# Patient Record
Sex: Male | Born: 1950 | Race: White | Hispanic: No | Marital: Single
Health system: Southern US, Community
[De-identification: ages and names within clinical notes are randomized; demographics above are authoritative.]

## PROBLEM LIST (undated history)

## (undated) DIAGNOSIS — E119 Type 2 diabetes mellitus without complications: Secondary | ICD-10-CM

## (undated) DIAGNOSIS — I509 Heart failure, unspecified: Secondary | ICD-10-CM

## (undated) DIAGNOSIS — R4182 Altered mental status, unspecified: Secondary | ICD-10-CM

## (undated) DIAGNOSIS — J449 Chronic obstructive pulmonary disease, unspecified: Secondary | ICD-10-CM

---

## 2020-10-11 ENCOUNTER — Other Ambulatory Visit: Payer: Self-pay

## 2020-10-11 ENCOUNTER — Emergency Department (HOSPITAL_COMMUNITY)
Admission: EM | Admit: 2020-10-11 | Discharge: 2020-10-11 | Disposition: A | Discharge status: Home / Self Care | Payer: Medicare Other | Attending: Emergency Medicine | Admitting: Emergency Medicine

## 2020-10-11 ENCOUNTER — Emergency Department (HOSPITAL_COMMUNITY): Payer: Medicare Other

## 2020-10-11 DIAGNOSIS — S63259A Unspecified dislocation of unspecified finger, initial encounter: Secondary | ICD-10-CM

## 2020-10-11 DIAGNOSIS — R0989 Other specified symptoms and signs involving the circulatory and respiratory systems: Secondary | ICD-10-CM | POA: Insufficient documentation

## 2020-10-11 DIAGNOSIS — Y92129 Unspecified place in nursing home as the place of occurrence of the external cause: Secondary | ICD-10-CM | POA: Insufficient documentation

## 2020-10-11 DIAGNOSIS — R062 Wheezing: Secondary | ICD-10-CM | POA: Diagnosis not present

## 2020-10-11 DIAGNOSIS — W19XXXA Unspecified fall, initial encounter: Secondary | ICD-10-CM | POA: Diagnosis not present

## 2020-10-11 DIAGNOSIS — S63282A Dislocation of proximal interphalangeal joint of right middle finger, initial encounter: Secondary | ICD-10-CM | POA: Diagnosis not present

## 2020-10-11 MED ORDER — IPRATROPIUM BROMIDE HFA 17 MCG/ACT IN AERS
2.0000 | INHALATION_SPRAY | Freq: Once | RESPIRATORY_TRACT | Status: AC
  Administered 2020-10-11: 09:00:00 2 via RESPIRATORY_TRACT
  Filled 2020-10-11: qty 12.9

## 2020-10-11 MED ORDER — ALBUTEROL SULFATE HFA 108 (90 BASE) MCG/ACT IN AERS
2.0000 | INHALATION_SPRAY | Freq: Once | RESPIRATORY_TRACT | Status: AC
  Administered 2020-10-11: 09:00:00 2 via RESPIRATORY_TRACT
  Filled 2020-10-11: qty 6.7

## 2020-10-11 MED ORDER — MORPHINE SULFATE (CONCENTRATE) 10 MG/0.5ML PO SOLN
20.0000 mg | Freq: Once | ORAL | Status: AC
  Administered 2020-10-11: 09:00:00 20 mg via ORAL
  Filled 2020-10-11: qty 1

## 2020-10-11 NOTE — ED Notes (Signed)
Pt was restless and needed to have a BM, moved pt into a room.  Placed him on bedpan and cleaned him up after.  Placed pt into new depend, wiped his eyes (lots of discharge bilaterally) and mouth.  Offered pt water and placed him in clean gown and changed linen.  Pt restful after this.  Await PTAR who was called during night shift.  Placed order for pt to get meal tray due to delay in transportation.  Night shift reported that report was called so at this time we are continuing to monitor pt and await transport

## 2020-10-11 NOTE — ED Provider Notes (Signed)
[name] Provider Note  CSN: [id number] Arrival date & time: 10/11/20 0100    History Chief Complaint  Patient presents with  . Fall    HPI  Raymond Parks is a 69 y.o. 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 He cannot tell me what happened. Staff noted he had a deformity of his R middle finger and so sent to the ED for evaluation. He is getting Fentanyl patches and PRN morphine for pain/dyspnea per MAR. Wakens to verbal stimuli but falls back asleep. Level 5 caveat due to somnolence.    No past medical history on file.    No family history on file.      Home Medications Prior to Admission medications   Not on File     Allergies    Patient has no allergy information on record.   Review of Systems   Review of Systems Unable to assess due to mental status.    Physical Exam BP 138/85   Pulse 68   Temp 98.5 F (36.9 C) (Oral)   Resp (!) 27   SpO2 92%   Physical Exam Vitals and nursing note reviewed.  Constitutional:      Appearance: Normal appearance. He is obese.  HENT:     Head: Normocephalic and atraumatic.     Nose: Nose normal.     Mouth/Throat:     Mouth: Mucous membranes are moist.  Eyes:     Extraocular Movements: Extraocular movements intact.     Conjunctiva/sclera: Conjunctivae normal.  Cardiovascular:     Rate and Rhythm: Normal rate.  Pulmonary:     Effort: Pulmonary effort is normal.     Breath sounds: Wheezing and rhonchi present.  Abdominal:     General: Abdomen is flat.     Palpations: Abdomen is soft.     Tenderness: There is no abdominal tenderness.  Musculoskeletal:        General: Swelling and deformity (R middle finger) present.     Cervical back: Neck supple.  Skin:    General: Skin is warm and dry.  Neurological:     General: No focal deficit present.     Mental Status: He is disoriented.   Psychiatric:        Mood and Affect: Mood normal.      ED Results / Procedures / Treatments   Labs (all labs ordered are listed, but only abnormal results are displayed) Labs Reviewed - No data to display  EKG None  Radiology DG Finger Middle Right  Result Date: 10/11/2020 CLINICAL DATA:  Fall, deformity EXAM: RIGHT MIDDLE FINGER 2+V COMPARISON:  None. FINDINGS: Posterior dislocation noted at the right middle finger PIP joint. Small avulsed fragment suspected. No additional acute bony abnormality. IMPRESSION: Posterior dislocation at the right 3rd PIP joint with probable small avulsed fragment adjacent to the distal aspect of the proximal phalanx. Electronically Signed   By: Charlett Nose M.D.   On: 10/11/2020 01:39    Procedures .Ortho Injury Treatment  Date/Time: 10/11/2020 2:00 AM Performed by: Pollyann Savoy, MD Authorized by: Pollyann Savoy, MD   Consent:    Consent obtained:  Verbal   Consent given by:  Patient   Risks discussed:  Irreducible dislocation and fracture   Alternatives discussed:  No treatmentInjury location: finger Location details: right long finger Injury type: fracture-dislocation Fracture type: proximal phalanx MCP joint involved: no Pre-procedure  distal perfusion: normal Pre-procedure neurological function: normal Pre-procedure range of motion: reduced Manipulation performed: yes Reduction successful: yes Immobilization: splint Splint type: static finger Post-procedure range of motion: improved     Medications Ordered in the ED Medications - No data to display   MDM Rules/Calculators/A&P MDM Patient with reported fall, no signs of head injury. He has a possible fx and/or dislocation of his R middle finger. He is on Hospice and has a DNR form at bedside.  ED Course  I have reviewed the triage vital signs and the nursing notes.  Pertinent labs & imaging results that were available during my care of the patient were reviewed by  me and considered in my medical decision making (see chart for details).  Clinical Course as of 10/11/20 0334  Fri Oct 11, 2020  0226 Xray images and results reviewed. Dislocation was reduced without difficulty. Finger splint to be applied by Ortho Tech.  Spoke with staff at SNF who report that they think he missed his wheelchair after coming back from restroom. He was sitting when they go to his room, did not have LOC or head injury. [CS]    Clinical Course User Index [CS] Pollyann Savoy, MD    Final Clinical Impression(s) / ED Diagnoses Final diagnoses:  Fall, initial encounter  Dislocation of finger, initial encounter    Rx / DC Orders ED Discharge Orders    None       Pollyann Savoy, MD 10/11/20 [phone]

## 2020-10-11 NOTE — ED Notes (Signed)
Pt has skin tear to right elbow, dressings are soiled.  Removed old dressing, applied antibiotic ointment and non adherent dressing per protocol

## 2020-10-11 NOTE — ED Provider Notes (Signed)
Patient has been waiting for transport back to his facility for the last 7 hours.  Patient is complaining of feeling short of breath and wanting to leave.  On further review patient is supposed to be on 3 L of oxygen chronically and has not been on oxygen since arrival to the emergency room.  Also he is to be receiving morphine 20 mg every hour as needed for pain or shortness of breath.  Patient does indicate that he would like something and it has been 7 hours since his last dose of medication.  He also is supposed to be using a Combivent inhaler and has some wheezing on exam.  Patient will be given albuterol, Atrovent as well and we will continue to wait for discharge.  He reports feeling better once oxygen was placed.   Gwyneth Sprout, MD 10/11/20 [phone]

## 2020-10-11 NOTE — ED Triage Notes (Addendum)
Pt arrives to ED BIB [facility] from [facility] due to having a fall. Per EMS pt had an unwitnessed fall and having a possible finger dislocation. Pt has obvious deformity to Rt middle finger. Pt EMS no LOC. Hx of COPD and on 3L [place]. Pt A/O x2. Pt keeps falling asleep when being question. Per EMS pt was given a Fentanyl patch prior to arrival.    BP 130 Palpated HR 64 O2 97% 2L [place]

## 2020-10-11 NOTE — Progress Notes (Signed)
Orthopedic Tech Progress Note Patient Details:  Raymond Parks 1950-10-31 [id number]  Ortho Devices Type of Ortho Device: Finger splint Ortho Device/Splint Location: rue. middle finger Ortho Device/Splint Interventions: Ordered,Application,Adjustment   Post Interventions Patient Tolerated: Well Instructions Provided: Care of device,Adjustment of device   Trinna Post 10/11/2020, 7:12 AM

## 2020-12-23 ENCOUNTER — Emergency Department (HOSPITAL_COMMUNITY)
Admission: EM | Admit: 2020-12-23 | Discharge: 2020-12-23 | Disposition: A | Discharge status: Home / Self Care | Payer: Medicare Other | Attending: Emergency Medicine | Admitting: Emergency Medicine

## 2020-12-23 ENCOUNTER — Other Ambulatory Visit: Payer: Self-pay

## 2020-12-23 ENCOUNTER — Encounter (HOSPITAL_COMMUNITY): Payer: Self-pay | Admitting: *Deleted

## 2020-12-23 ENCOUNTER — Emergency Department (HOSPITAL_COMMUNITY): Payer: Medicare Other

## 2020-12-23 DIAGNOSIS — J441 Chronic obstructive pulmonary disease with (acute) exacerbation: Secondary | ICD-10-CM | POA: Diagnosis not present

## 2020-12-23 DIAGNOSIS — R410 Disorientation, unspecified: Secondary | ICD-10-CM | POA: Diagnosis not present

## 2020-12-23 DIAGNOSIS — F0390 Unspecified dementia without behavioral disturbance: Secondary | ICD-10-CM | POA: Insufficient documentation

## 2020-12-23 DIAGNOSIS — R0602 Shortness of breath: Secondary | ICD-10-CM | POA: Diagnosis present

## 2020-12-23 HISTORY — DX: Chronic obstructive pulmonary disease, unspecified: J44.9

## 2020-12-23 HISTORY — DX: Heart failure, unspecified: I50.9

## 2020-12-23 HISTORY — DX: Type 2 diabetes mellitus without complications: E11.9

## 2020-12-23 HISTORY — DX: Altered mental status, unspecified: R41.82

## 2020-12-23 LAB — CBC WITH DIFFERENTIAL/PLATELET
Abs Immature Granulocytes: 0 10*3/uL (ref 0.00–0.07)
Basophils Absolute: 0 10*3/uL (ref 0.0–0.1)
Basophils Relative: 0 %
Eosinophils Absolute: 0 10*3/uL (ref 0.0–0.5)
Eosinophils Relative: 0 %
HCT: 45.6 % (ref 39.0–52.0)
Hemoglobin: 14.7 g/dL (ref 13.0–17.0)
Lymphocytes Relative: 9 %
Lymphs Abs: 0.8 10*3/uL (ref 0.7–4.0)
MCH: 30.5 pg (ref 26.0–34.0)
MCHC: 32.2 g/dL (ref 30.0–36.0)
MCV: 94.6 fL (ref 80.0–100.0)
Monocytes Absolute: 0.3 10*3/uL (ref 0.1–1.0)
Monocytes Relative: 3 %
Neutro Abs: 8 10*3/uL — ABNORMAL HIGH (ref 1.7–7.7)
Neutrophils Relative %: 88 %
Platelets: 155 10*3/uL (ref 150–400)
RBC: 4.82 MIL/uL (ref 4.22–5.81)
RDW: 14.7 % (ref 11.5–15.5)
WBC: 9.1 10*3/uL (ref 4.0–10.5)
nRBC: 0 /100 WBC
nRBC: 0.2 % (ref 0.0–0.2)

## 2020-12-23 LAB — BASIC METABOLIC PANEL
Anion gap: 11 (ref 5–15)
BUN: 23 mg/dL (ref 8–23)
CO2: 35 mmol/L — ABNORMAL HIGH (ref 22–32)
Calcium: 8.9 mg/dL (ref 8.9–10.3)
Chloride: 93 mmol/L — ABNORMAL LOW (ref 98–111)
Creatinine, Ser: 0.72 mg/dL (ref 0.61–1.24)
GFR, Estimated: >60 mL/min (ref >60)
Glucose, Bld: 155 mg/dL — ABNORMAL HIGH (ref 70–99)
Potassium: 4.1 mmol/L (ref 3.5–5.1)
Sodium: 139 mmol/L (ref 135–145)

## 2020-12-23 LAB — BRAIN NATRIURETIC PEPTIDE: B Natriuretic Peptide: 68.7 pg/mL (ref 0.0–100.0)

## 2020-12-23 MED ORDER — ALBUTEROL (5 MG/ML) CONTINUOUS INHALATION SOLN
10.0000 mg/h | INHALATION_SOLUTION | RESPIRATORY_TRACT | Status: DC
  Administered 2020-12-23: 10 mg/h via RESPIRATORY_TRACT

## 2020-12-23 MED ORDER — METHYLPREDNISOLONE SODIUM SUCC 125 MG IJ SOLR
125.0000 mg | Freq: Once | INTRAMUSCULAR | Status: AC
  Administered 2020-12-23: 125 mg via INTRAVENOUS
  Filled 2020-12-23: qty 2

## 2020-12-23 MED ORDER — ALBUTEROL (5 MG/ML) CONTINUOUS INHALATION SOLN
INHALATION_SOLUTION | RESPIRATORY_TRACT | Status: AC
  Filled 2020-12-23: qty 20

## 2020-12-23 MED ORDER — PREDNISONE 50 MG PO TABS: ORAL_TABLET | ORAL | 0 refills | Status: AC

## 2020-12-23 MED ORDER — ALBUTEROL (5 MG/ML) CONTINUOUS INHALATION SOLN
10.0000 mg/h | INHALATION_SOLUTION | Freq: Once | RESPIRATORY_TRACT | Status: AC
  Administered 2020-12-23: 10 mg/h via RESPIRATORY_TRACT

## 2020-12-23 NOTE — ED Notes (Signed)
The pt not keeping 02 in place

## 2020-12-23 NOTE — ED Notes (Signed)
Secretary called PTAR to see if they had been called.  They stated they had not been called.  Request placed.

## 2020-12-23 NOTE — ED Notes (Signed)
Lunch Tray Ordered @ 1001. 

## 2020-12-23 NOTE — ED Notes (Signed)
Called facility and notified them that pt was leaving [facility] via [facility].  Was notified by night shift that report had been called.  Attempted to be transferred to pt's unit, but no one picked up phone.

## 2020-12-23 NOTE — ED Triage Notes (Signed)
The pt arrived by [employer] from a nursing home [facility] c/o sob  sats in the 70s when ems arrived   Nasal 02 at 4 liters iv per ems  cbg 325 alert audible wheezes

## 2020-12-23 NOTE — Discharge Instructions (Signed)
Please give patient the steroids for the next 4 days. Be sure to give him albuterol every 4 hours the next 48 hours while at the facility.

## 2020-12-23 NOTE — ED Provider Notes (Signed)
[facility] EMERGENCY DEPARTMENT Provider Note   CSN: [id number] Arrival date & time: 12/23/20  0114     History Chief Complaint  Patient presents with  . Shortness of Breath  Level 5 caveat due to dementia  Raymond Parks is a 70 y.o. male.  The history is provided by the patient. The history is limited by the condition of the patient.  Shortness of Breath Severity:  Severe Onset quality:  Sudden Timing:  Constant Progression:  Unchanged Chronicity:  New Relieved by:  Nothing Worsened by:  Nothing Patient with history of COPD on oxygen 3 L, diabetes, chronic respiratory failure, dementia, chronic pain presents from nursing facility for shortness of breath.  Patient resides at [facility] in [place] It is reported the patient had sudden onset of shortness of breath at the facility.  EMS was called he was noted to be hypoxic.  Patient was sent for further evaluation. No other details known on arrival     Past Medical History:  Diagnosis Date  . COPD (chronic obstructive pulmonary disease) (HCC)     Soc hx - unknown       Home Medications Prior to Admission medications   Not on File    Allergies    Patient has no allergy information on record.  Review of Systems   Review of Systems  Unable to perform ROS: Dementia  Respiratory: Positive for shortness of breath.     Physical Exam Updated Vital Signs BP 118/73   Pulse 84   Temp (!) 97 F (36.1 C)   Resp (!) 22   Ht 1.651 m (5\' 5" )   Wt 127 kg   SpO2 96%   BMI 46.59 kg/m   Physical Exam CONSTITUTIONAL: Chronically ill-appearing HEAD: Normocephalic/atraumatic EYES: EOMI ENMT: Mucous membranes moist, nebulizer mask in place NECK: supple no meningeal signs SPINE/BACK:entire spine nontender CV: S1/S2 noted, no murmurs/rubs/gallops noted LUNGS: Tachypnea, wheezing bilaterally ABDOMEN: soft, nontender NEURO: Pt is awake/alert, moves all extremitiesx4.  No facial droop.  Patient  appears mildly confused on questioning EXTREMITIES:  full ROM, chronic skin changes noted to the bilateral lower extremities, minimal edema noted bilateral lower extremities.  No deformities.  Distal cap refill around 3 seconds in both feet SKIN: warm, color normal, scattered bruising noted throughout all 4 extremities PSYCH: no abnormalities of mood noted, alert and oriented to situation  ED Results / Procedures / Treatments   Labs (all labs ordered are listed, but only abnormal results are displayed) Labs Reviewed  BASIC METABOLIC PANEL - Abnormal; Notable for the following components:      Result Value   Chloride 93 (*)    CO2 35 (*)    Glucose, Bld 155 (*)    All other components within normal limits  CBC WITH DIFFERENTIAL/PLATELET - Abnormal; Notable for the following components:   Neutro Abs 8.0 (*)    All other components within normal limits  BRAIN NATRIURETIC PEPTIDE    EKG EKG Interpretation  Date/Time:  Monday December 23 2020 01:15:59 EST Ventricular Rate:  79 PR Interval:    QRS Duration: 115 QT Interval:  386 QTC Calculation: 443 R Axis:   -73 Text Interpretation: Sinus rhythm Borderline prolonged PR interval Incomplete RBBB and LAFB Low voltage, precordial leads Consider anterior infarct No previous ECGs available Interpretation limited secondary to artifact Confirmed by [id number] (Zadie Rhine) on 12/23/2020 1:22:34 AM   Radiology No results found.  Procedures Procedures   Medications Ordered in ED Medications  albuterol (  PROVENTIL,VENTOLIN) solution continuous neb (10 mg/hr Nebulization New Bag/Given 12/23/20 0135)  albuterol (VENTOLIN) (5 MG/ML) 0.5% continuous inhalation solution (has no administration in time range)  albuterol (PROVENTIL,VENTOLIN) solution continuous neb (has no administration in time range)  methylPREDNISolone sodium succinate (SOLU-MEDROL) 125 mg/2 mL injection 125 mg (125 mg Intravenous Given 12/23/20 0224)    ED Course  I have  reviewed the triage vital signs and the nursing notes.  Pertinent labs & imaging results that were available during my care of the patient were reviewed by me and considered in my medical decision making (see chart for details).    MDM Rules/Calculators/A&P                          2:46 AM Patient with history of chronic respiratory failure on oxygen presents with shortness of breath.  Per nursing staff at the facility he had abrupt onset of shortness of breath and EMS was called.  EMS reports patient was hypoxic to the 70s.  Patient is now receiving a nebulized treatment.  We will follow closely. 5:12 AM Patient monitored for several hours and appears improved.  Patient resting comfortably on his home oxygen and pulse ox is around 94%  patient has residual wheeze but is overall in no acute distress X-ray was reviewed and no signs of pneumonia.  Vitals overall appropriate.  Suspect this represents COPD exacerbation and is improving.  This could be treated at his nursing facility.  Will advise continue use of albuterol, will add on prednisone for additional 4 days Final Clinical Impression(s) / ED Diagnoses Final diagnoses:  COPD exacerbation (HCC)    Rx / DC Orders ED Discharge Orders         Ordered    predniSONE (DELTASONE) 50 MG tablet        12/23/20 0511           Zadie Rhine, MD 12/23/20 [phone]

## 2020-12-23 NOTE — ED Notes (Signed)
The pt has a dnr bracelet on his rt arm

## 2021-02-23 DEATH — deceased

## 2021-11-17 IMAGING — DX DG CHEST 1V PORT
1 series · 1 of 1 positions shown · non-contrast
Comparison: 12/17/2020

CLINICAL DATA: Shortness of breath

EXAM:
PORTABLE CHEST 1 VIEW

[chest ap]
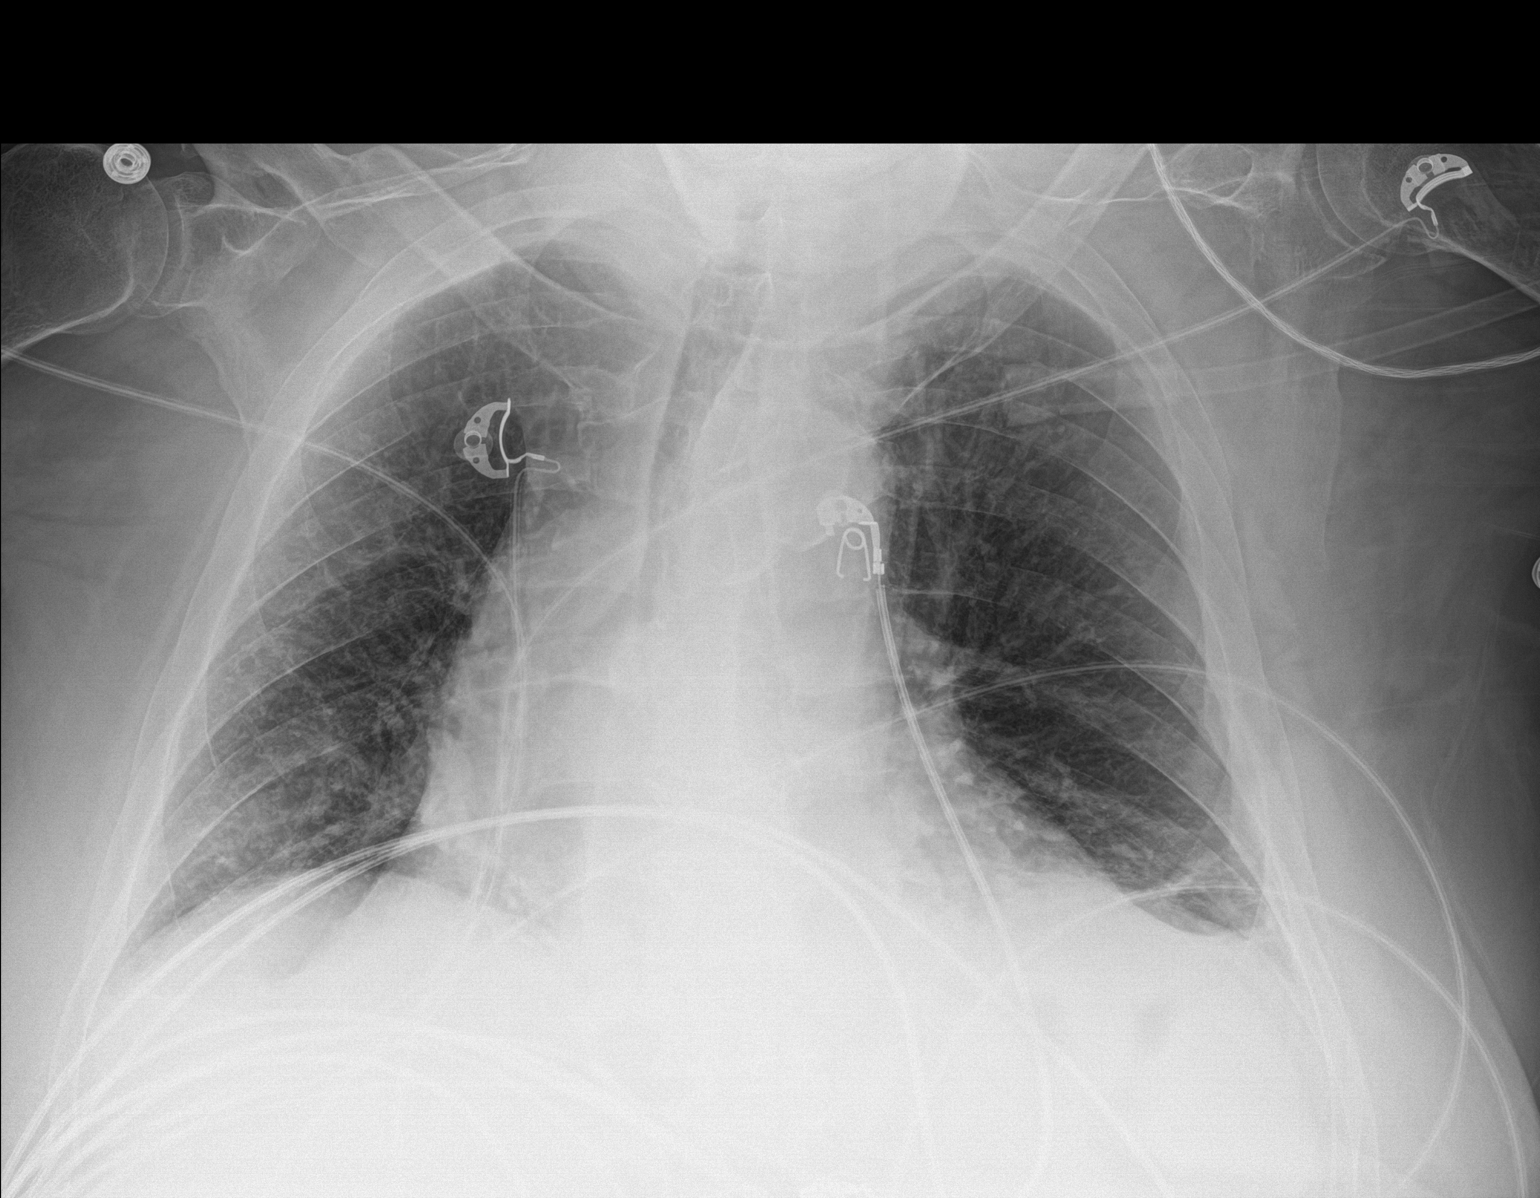

[1 of 1 positions shown; findings below may reference images not displayed]

FINDINGS: There is blunting of the left costophrenic angle suggestive of a
left-sided pleural effusion. There appears to be atelectasis at the
lung bases. The heart size is enlarged but stable. There is no
definite pneumothorax. There is no acute osseous abnormality. There
is an old healed right clavicle fracture.
IMPRESSION: Probable small left-sided pleural effusion with adjacent
atelectasis, new since recent prior study.
